# Patient Record
Sex: Male | Born: 1975 | Race: White | Hispanic: No | Marital: Married | Smoking: Former smoker
Health system: Southern US, Community
[De-identification: ages and names within clinical notes are randomized; demographics above are authoritative.]

## PROBLEM LIST (undated history)

## (undated) DIAGNOSIS — I10 Essential (primary) hypertension: Secondary | ICD-10-CM

## (undated) DIAGNOSIS — K219 Gastro-esophageal reflux disease without esophagitis: Secondary | ICD-10-CM

## (undated) HISTORY — PX: KNEE SURGERY: SHX244

## (undated) HISTORY — PX: HERNIA REPAIR: SHX51

---

## 2016-06-08 ENCOUNTER — Encounter: Payer: Self-pay | Admitting: Emergency Medicine

## 2016-06-08 ENCOUNTER — Emergency Department (INDEPENDENT_AMBULATORY_CARE_PROVIDER_SITE_OTHER): Payer: 59

## 2016-06-08 ENCOUNTER — Emergency Department
Admission: EM | Admit: 2016-06-08 | Discharge: 2016-06-08 | Disposition: A | Discharge status: Home / Self Care | Payer: 59 | Source: Home / Self Care | Attending: Family Medicine | Admitting: Family Medicine

## 2016-06-08 DIAGNOSIS — S76311A Strain of muscle, fascia and tendon of the posterior muscle group at thigh level, right thigh, initial encounter: Secondary | ICD-10-CM

## 2016-06-08 DIAGNOSIS — M25531 Pain in right wrist: Secondary | ICD-10-CM | POA: Diagnosis not present

## 2016-06-08 DIAGNOSIS — S63501A Unspecified sprain of right wrist, initial encounter: Secondary | ICD-10-CM

## 2016-06-08 HISTORY — DX: Essential (primary) hypertension: I10

## 2016-06-08 MED ORDER — MELOXICAM 15 MG PO TABS: 15.0000 mg | ORAL_TABLET | Freq: Every day | ORAL | Status: DC

## 2016-06-08 NOTE — ED Notes (Signed)
Patient fell this morning while training at work, hurting Right wrist, Right hamstring, abrasions on both elbows. He does not want to claim Worker's Comp.

## 2016-06-08 NOTE — Discharge Instructions (Signed)
Apply ice pack for 20 to 30 minutes, every 3 to 4 hours.  Continue until pain and swelling decrease.  Wear wrist splint.  Begin hamstring exercises as tolerated.  Begin wrist exercises when sharp pain subsides.   Hamstring Strain A hamstring strain is an injury that occurs when the hamstring muscles are overstretched or overloaded. The hamstring muscles are a group of muscles at the back of the thighs. These muscles are used in straightening the hips, bending the knees, and pulling back the legs. This type of injury is often called a pulled hamstring muscle. The severity of a muscle strain is rated in degrees. First-degree strains have the least amount of muscle fiber tearing and pain. Second-degree and third-degree strains have increasingly more tearing and pain. CAUSES Hamstring strains occur when a sudden, violent force is placed on these muscles and stretches them too far. This often occurs during activities that involve running, jumping, kicking, or weight lifting. RISK FACTORS Hamstring strains are especially common in athletes. Other things that can increase your risk for this injury include:  Having low strength, endurance, or flexibility of the hamstring muscles.  Performing high-impact physical activity.  Having poor physical fitness.  Having a previous leg injury.  Having fatigued muscles.  Older age. SIGNS AND SYMPTOMS  Pain in the back of the thigh.  Bruising.  Swelling.  Muscle spasm.  Difficulty using the muscle because of pain or lack of normal function. For severe strains, you may have a popping or snapping feeling when the injury occurs. DIAGNOSIS Your health care provider will perform a physical exam and ask about your medical history.  TREATMENT Often, the best treatment for a hamstring strain is protecting, resting, icing, applying compression, and elevating the injured area. This is referred to as the PRICE method of treatment. Your health care provider may  also recommend medicines to help reduce pain or inflammation. HOME CARE INSTRUCTIONS  Use the PRICE method of treatment to promote muscle healing during the first 2-3 days after your injury. The PRICE method involves:  P--Protecting the muscle from being injured again.  R--Restricting your activity and resting the injured body part.  I--Icing your injury. To do this, put ice in a plastic bag. Place a towel between your skin and the bag. Then, apply the ice and leave it on for 20 minutes, 2-3 times per day. After the third day, switch to moist heat packs.  C--Applying compression to the injured area with an elastic bandage. Be careful not to wrap it too tightly. That may interfere with blood circulation or may increase swelling.  E--Elevating the injured body part above the level of your heart as often as you can. You can do this by putting a pillow under your thigh when you sit or lie down.  Take medicines only as directed by your health care provider.  Begin exercising or stretching as directed by your health care provider.  Do not return to full activity level until your health care provider approves.  Keep all follow-up visits as directed by your health care provider. This is important. SEEK MEDICAL CARE IF:  You have increasing pain or swelling in the injured area.  You have numbness, tingling, or a significant loss of strength in the injured area.  Your foot or your toes become cold or turn blue.   This information is not intended to replace advice given to you by your health care provider. Make sure you discuss any questions you have with your health  care provider.   Document Released: 09/05/2001 Document Revised: 01/01/2015 Document Reviewed: 07/27/2014 Elsevier Interactive Patient Education 2016 [employer]   Wrist Sprain With Rehab A sprain is an injury in which a ligament that maintains the proper alignment of a joint is partially or completely torn. The ligaments of  the wrist are susceptible to sprains. Sprains are classified into three categories. Grade 1 sprains cause pain, but the tendon is not lengthened. Grade 2 sprains include a lengthened ligament because the ligament is stretched or partially ruptured. With grade 2 sprains there is still function, although the function may be diminished. Grade 3 sprains are characterized by a complete tear of the tendon or muscle, and function is usually impaired. SYMPTOMS   Pain tenderness, inflammation, and/or bruising (contusion) of the injury.  A "pop" or tear felt and/or heard at the time of injury.  Decreased wrist function. CAUSES  A wrist sprain occurs when a force is placed on one or more ligaments that is greater than it/they can withstand. Common mechanisms of injury include:  Catching a ball with your hands.  Repetitive and/ or strenuous extension or flexion of the wrist. RISK INCREASES WITH:  Previous wrist injury.  Contact sports (boxing or wrestling).  Activities in which falling is common.  Poor strength and flexibility.  Improperly fitted or padded protective equipment. PREVENTION  Warm up and stretch properly before activity.  Allow for adequate recovery between workouts.  Maintain physical fitness:  Strength, flexibility, and endurance.  Cardiovascular fitness.  Protect the wrist joint by limiting its motion with the use of taping, braces, or splints.  Protect the wrist after injury for 6 to 12 months. PROGNOSIS  The prognosis for wrist sprains depends on the degree of injury. Grade 1 sprains require 2 to 6 weeks of treatment. Grade 2 sprains require 6 to 8 weeks of treatment, and grade 3 sprains require up to 12 weeks.  RELATED COMPLICATIONS   Prolonged healing time, if improperly treated or re-injured.  Recurrent symptoms that result in a chronic problem.  Injury to nearby structures (bone, cartilage, nerves, or tendons).  Arthritis of the wrist.  Inability to  compete in athletics at a high level.  Wrist stiffness or weakness.  Progression to a complete rupture of the ligament. TREATMENT  Treatment initially involves resting from any activities that aggravate the symptoms, and the use of ice and medications to help reduce pain and inflammation. Your caregiver may recommend immobilizing the wrist for a period of time in order to reduce stress on the ligament and allow for healing. After immobilization it is important to perform strengthening and stretching exercises to help regain strength and a full range of motion. These exercises may be completed at home or with a therapist. Surgery is not usually required for wrist sprains, unless the ligament has been ruptured (grade 3 sprain). MEDICATION   If pain medication is necessary, then nonsteroidal anti-inflammatory medications, such as aspirin and ibuprofen, or other Auth pain relievers, such as acetaminophen, are often recommended.  Do not take pain medication for 7 days before surgery.  Prescription pain relievers may be given if deemed necessary by your caregiver. Use only as directed and only as much as you need. HEAT AND COLD  Cold treatment (icing) relieves pain and reduces inflammation. Cold treatment should be applied for 10 to 15 minutes every 2 to 3 hours for inflammation and pain and immediately after any activity that aggravates your symptoms. Use ice packs or massage the area with  a piece of ice (ice massage).  Heat treatment may be used prior to performing the stretching and strengthening activities prescribed by your caregiver, physical therapist, or athletic trainer. Use a heat pack or soak your injury in warm water. SEEK MEDICAL CARE IF:  Treatment seems to offer no benefit, or the condition worsens.  Any medications produce adverse side effects.

## 2016-06-08 NOTE — ED Provider Notes (Signed)
CSN: [id number]     Arrival date & time 06/08/16  1151 History   First MD Initiated Contact with Patient 06/08/16 1238     Chief Complaint  Patient presents with  . Fall     HPI Comments: Patient was sprinting during a training session at work approximately 3 hours ago.  He felt a pulling sensation in his right posterior thigh and fell, bracing himself with his right wrist.  He has had persistent soreness in his right posterior thigh, and pain in his right wrist.  Patient is a 40 y.o. male presenting with wrist pain and leg pain. The history is provided by the patient.  Wrist Pain This is a new problem. Episode onset: 3 hours ago. The problem occurs constantly. The problem has not changed since onset.Associated symptoms comments: Brief tingling in right 3rd, 4th, and 5th fingers.. Exacerbated by: flexing and extending right wrist. Nothing relieves the symptoms. He has tried nothing for the symptoms.  Leg Pain Location:  Leg Time since incident:  3 hours Injury: yes   Mechanism of injury: fall   Fall:    Fall occurred:  Running   Impact surface:  Grass Leg location:  R upper leg Pain details:    Quality:  Aching   Radiates to:  Does not radiate   Severity:  Moderate   Duration:  3 hours   Timing:  Constant   Progression:  Unchanged Chronicity:  New Prior injury to area:  No Relieved by:  None tried Worsened by:  Bearing weight, flexion and activity Ineffective treatments:  Acetaminophen Associated symptoms: stiffness   Associated symptoms: no back pain, no decreased ROM, no muscle weakness, no numbness, no swelling and no tingling     Past Medical History  Diagnosis Date  . Hypertension    Past Surgical History  Procedure Laterality Date  . Hernia repair    . Knee surgery     No family history on file. Social History  Substance Use Topics  . Smoking status: Never Smoker   . Smokeless tobacco: None  . Alcohol Use: No    Review of Systems  Musculoskeletal: Positive  for stiffness. Negative for back pain.  All other systems reviewed and are negative.   Allergies  Review of patient's allergies indicates no known allergies.  Home Medications   Prior to Admission medications   Medication Sig Start Date End Date Taking? Authorizing Provider  lisinopril (PRINIVIL,ZESTRIL) 10 MG tablet Take 10 mg by mouth daily.   Yes Historical Provider, MD  omeprazole (PRILOSEC) 10 MG capsule Take 10 mg by mouth daily.   Yes Historical Provider, MD  meloxicam (MOBIC) 15 MG tablet Take 1 tablet (15 mg total) by mouth daily. Take with food each morning 06/08/16   Lattie Haw, MD   Meds Ordered and Administered this Visit  Medications - No data to display  BP 108/73 mmHg  Pulse 81  Temp(Src) 98 F (36.7 C) (Oral)  Ht  (1.676 m)  Wt 173 lb (78.472 kg)  BMI 27.94 kg/m2  SpO2 97% No data found.   Physical Exam  Constitutional: He is oriented to person, place, and time. He appears well-developed and well-nourished. No distress.  HENT:  Head: Atraumatic.  Right Ear: External ear normal.  Left Ear: External ear normal.  Nose: Nose normal.  Eyes: Conjunctivae are normal. Pupils are equal, round, and reactive to light.  Pulmonary/Chest: No respiratory distress.  Musculoskeletal:       Left wrist: He  exhibits decreased range of motion, tenderness and bony tenderness. He exhibits no swelling, no effusion, no crepitus, no deformity and no laceration.       Arms:      Right upper leg: He exhibits tenderness. He exhibits no bony tenderness, no swelling, no edema, no deformity and no laceration.       Legs: Right wrist has tenderness to palpation over ulnar aspect.  Right posterior thigh has tenderness to palpation as noted on diagram.  No swelling or ecchymosis.  Right hip has full range of motion.  Distal neurovascular function is intact.   Neurological: He is alert and oriented to person, place, and time.  Skin: Skin is warm and dry.  Nursing note and vitals  reviewed.   ED Course  Procedures none    Imaging Review Dg Wrist Complete Right  06/08/2016  CLINICAL DATA:  Pain following fall EXAM: RIGHT WRIST - COMPLETE 3+ VIEW COMPARISON:  None. FINDINGS: Frontal, oblique, lateral, and ulnar deviation scaphoid images were obtained. There is no demonstrable fracture or dislocation. The joint spaces appear normal. No erosive change. IMPRESSION: No fracture or dislocation.  No appreciable arthropathy. Electronically Signed   By: Bretta BangWilliam  Woodruff III M.D.   On: 06/08/2016 13:23       MDM   1. Right hamstring muscle strain, initial encounter   2. Sprain of right wrist, initial encounter (concern for possible TFCC injury)   Wrist splint applied.  Begin Mobic 15mg  daily. Apply ice pack for 20 to 30 minutes, every 3 to 4 hours.  Continue until pain and swelling decrease.  Wear wrist splint.  Begin hamstring exercises as tolerated.  Begin wrist exercises when sharp pain subsides. Followup with Dr. Rodney Langtonhomas Thekkekandam or Dr. Clementeen GrahamEvan Corey ([facility]) if not improving about two weeks.     Lattie HawStephen A Atthew Coutant, MD 06/08/16 [id number]

## 2017-05-30 IMAGING — DX DG WRIST COMPLETE 3+V*R*
4 series · 4 of 4 positions shown · non-contrast
Comparison: None.

CLINICAL DATA: Pain following fall

EXAM:
RIGHT WRIST - COMPLETE 3+ VIEW

[wrist pa]
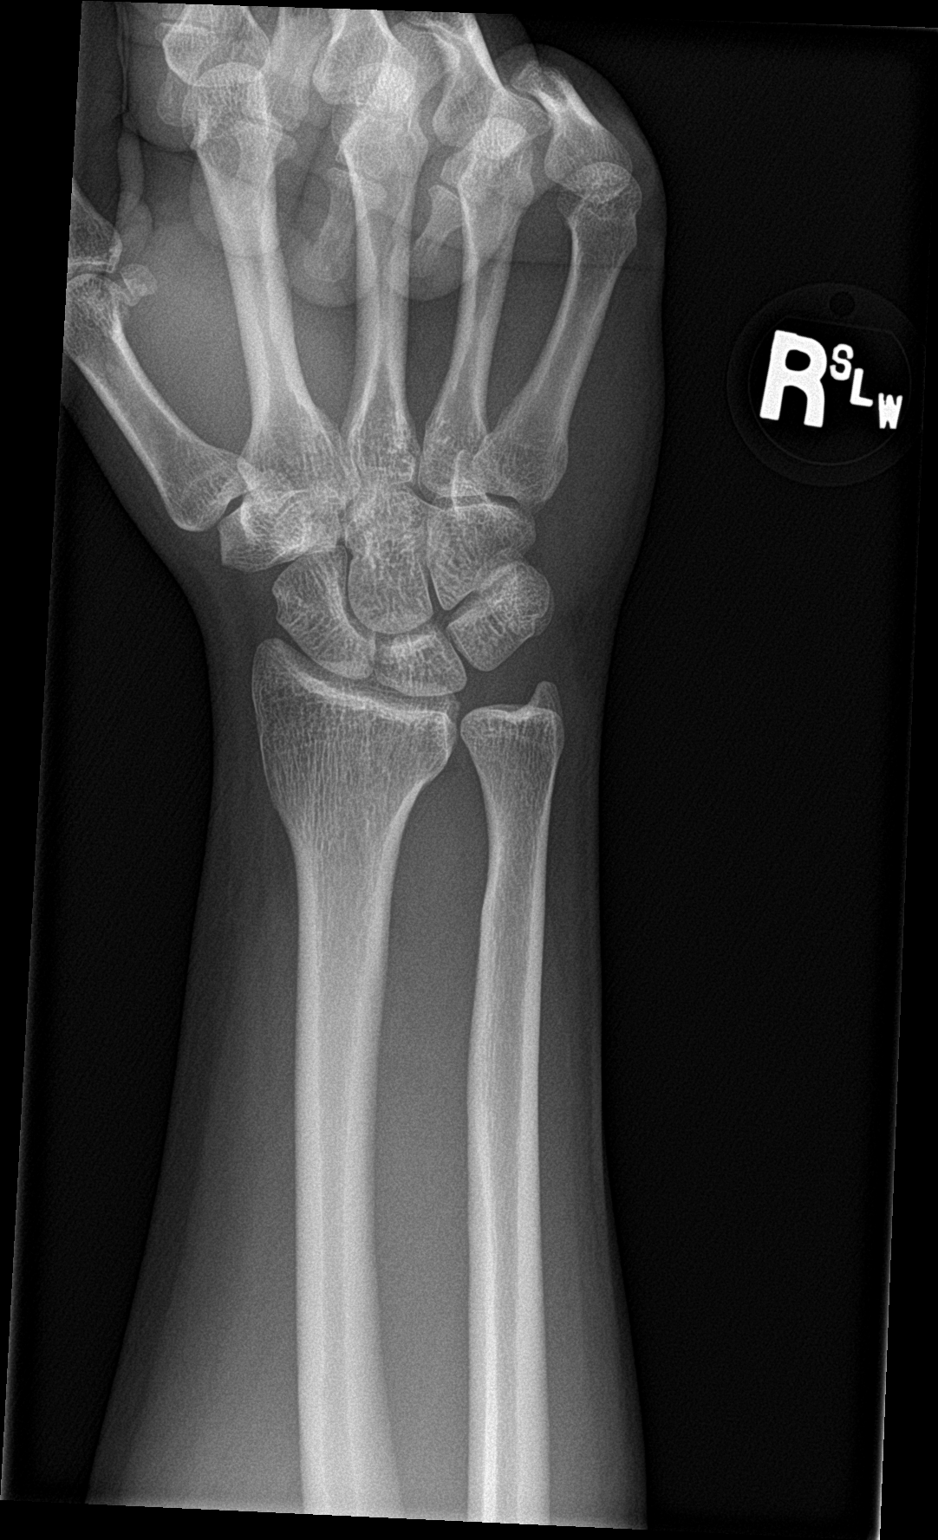

[wrist obl]
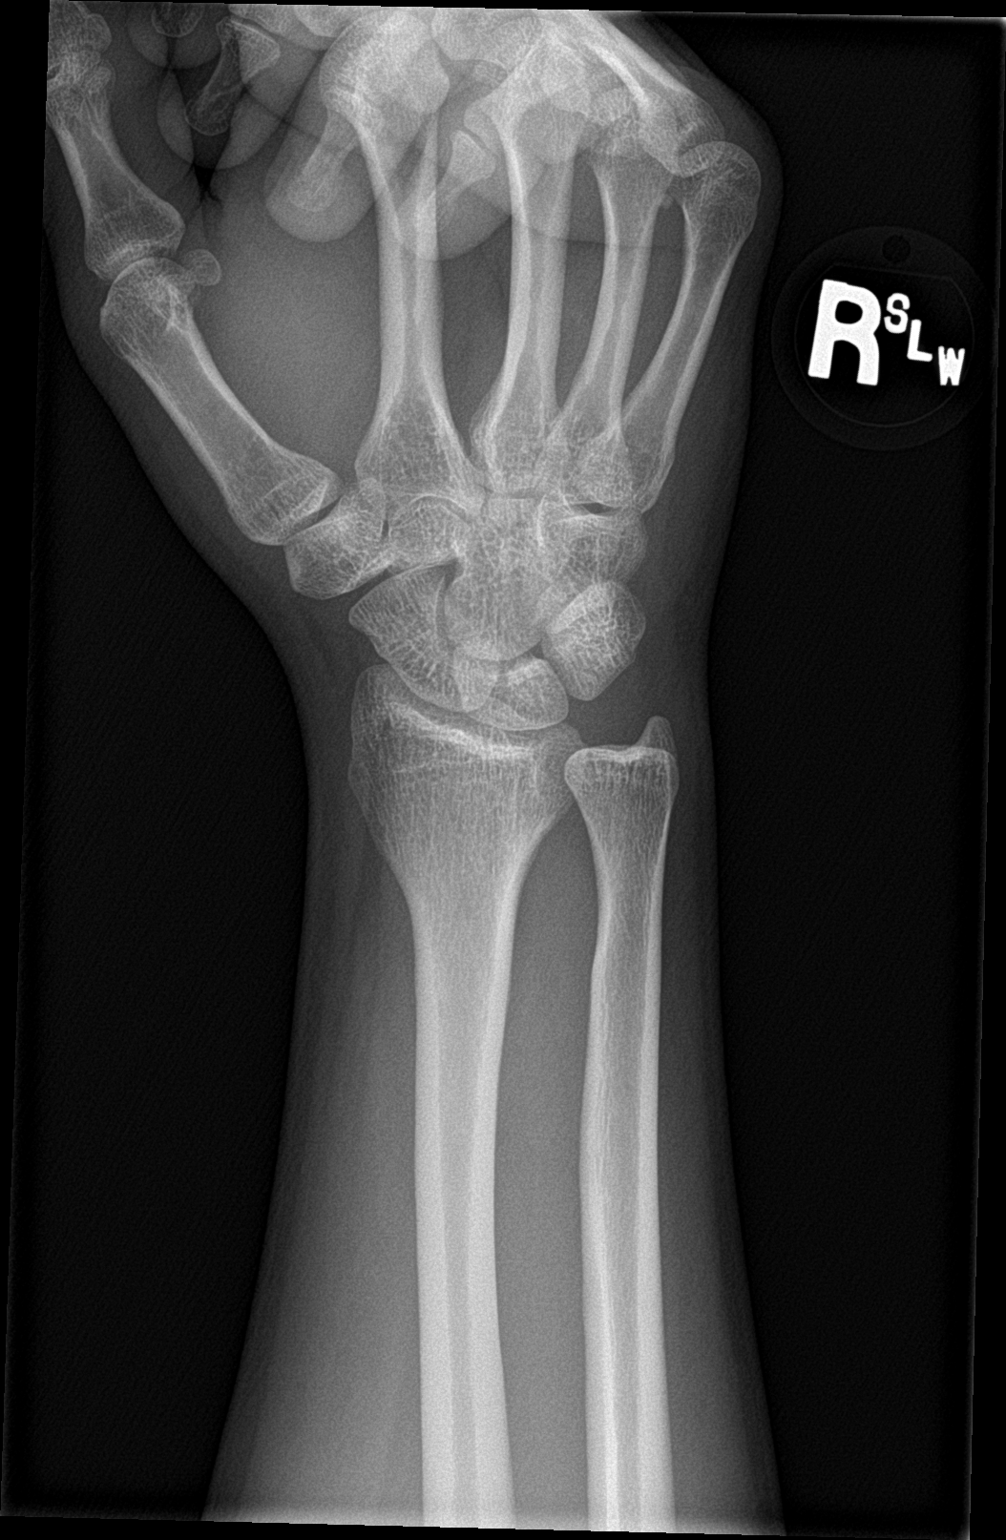

[wrist lat]
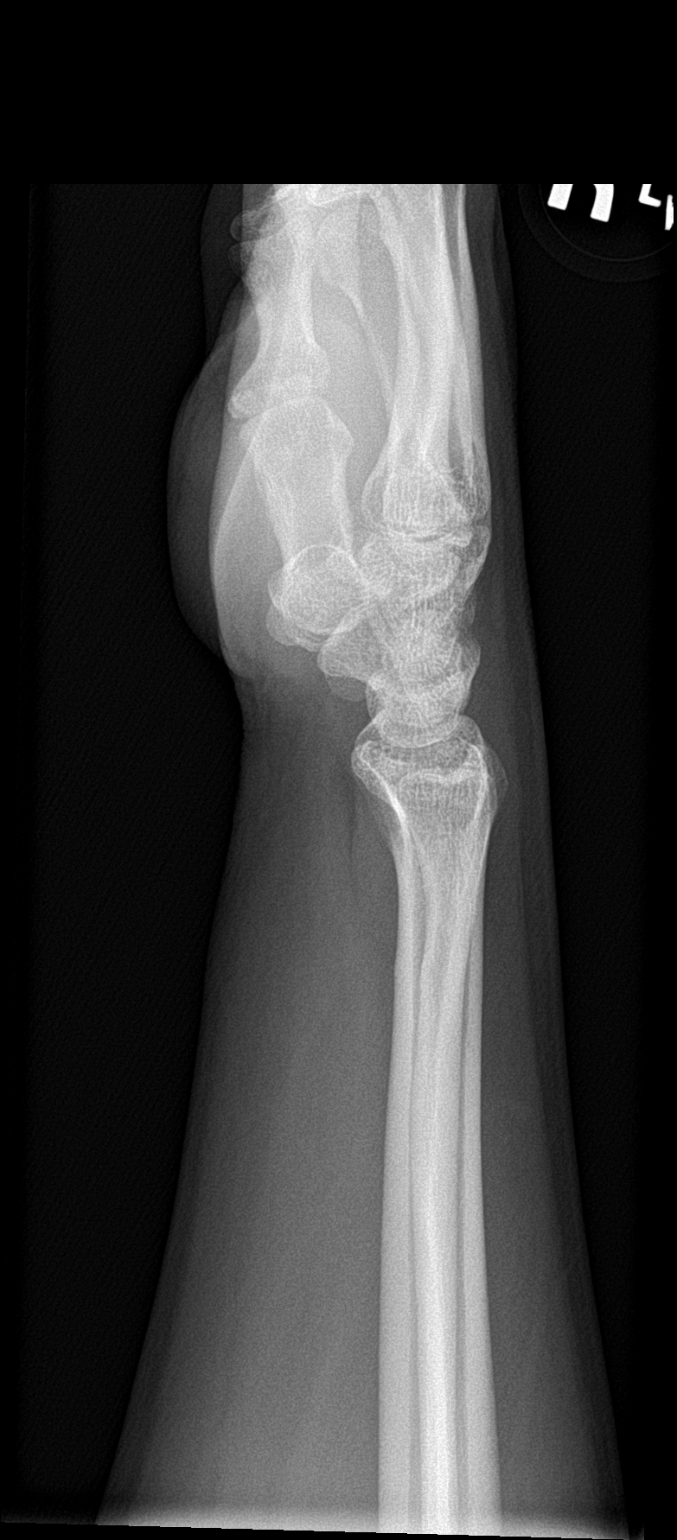

[wrist navicular]
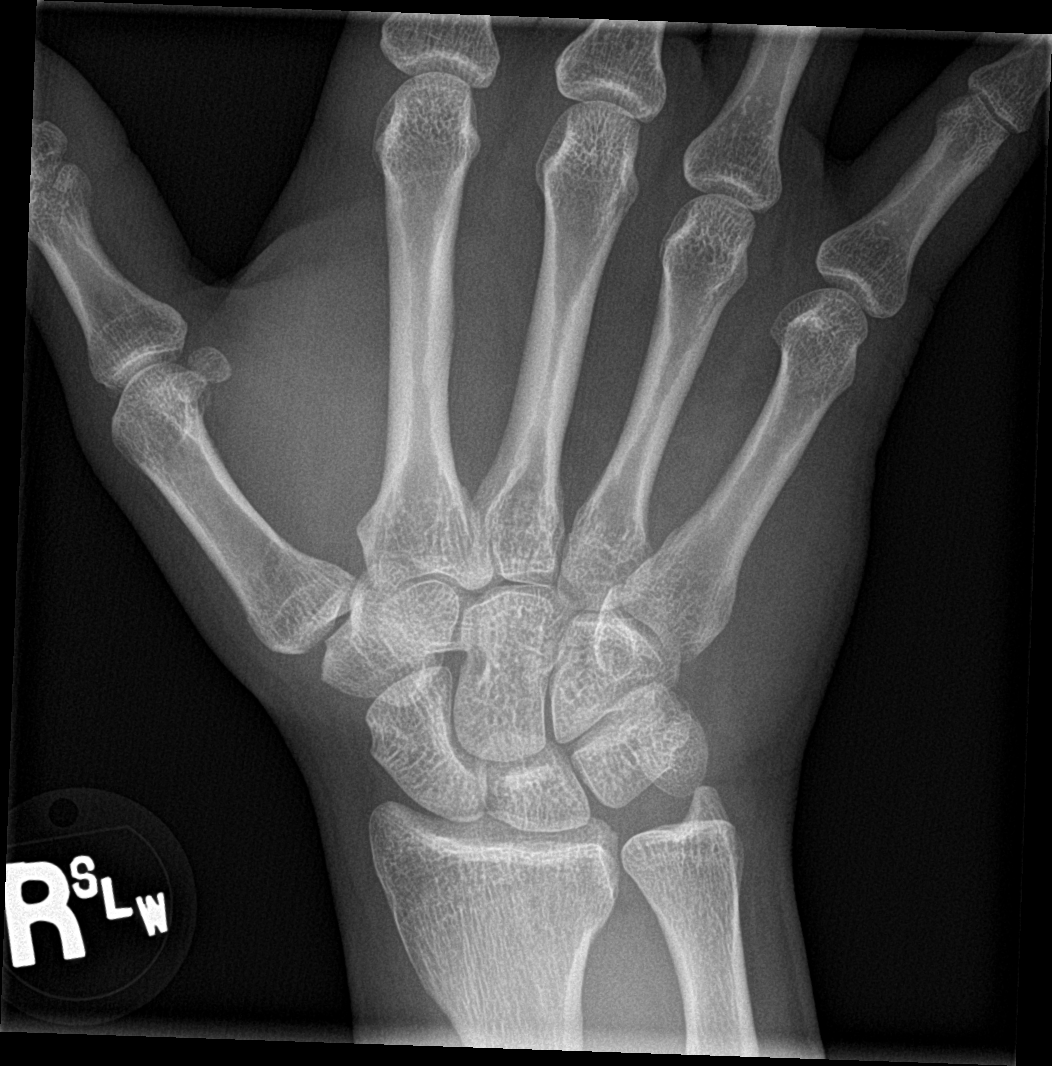

[4 of 4 positions shown; findings below may reference images not displayed]

FINDINGS: Frontal, oblique, lateral, and ulnar deviation scaphoid images were
obtained. There is no demonstrable fracture or dislocation. The
joint spaces appear normal. No erosive change.
IMPRESSION: No fracture or dislocation.  No appreciable arthropathy.

## 2018-02-03 ENCOUNTER — Encounter: Payer: Self-pay | Admitting: Emergency Medicine

## 2018-02-03 ENCOUNTER — Emergency Department (INDEPENDENT_AMBULATORY_CARE_PROVIDER_SITE_OTHER): Payer: 59

## 2018-02-03 ENCOUNTER — Emergency Department
Admission: EM | Admit: 2018-02-03 | Discharge: 2018-02-03 | Disposition: A | Discharge status: Home / Self Care | Payer: 59 | Source: Home / Self Care | Attending: Family Medicine | Admitting: Family Medicine

## 2018-02-03 DIAGNOSIS — S60312A Abrasion of left thumb, initial encounter: Secondary | ICD-10-CM | POA: Diagnosis not present

## 2018-02-03 DIAGNOSIS — M79645 Pain in left finger(s): Secondary | ICD-10-CM

## 2018-02-03 DIAGNOSIS — W010XXA Fall on same level from slipping, tripping and stumbling without subsequent striking against object, initial encounter: Secondary | ICD-10-CM | POA: Diagnosis not present

## 2018-02-03 MED ORDER — IBUPROFEN 600 MG PO TABS
600.0000 mg | ORAL_TABLET | Freq: Once | ORAL | Status: AC
  Administered 2018-02-03: 600 mg via ORAL

## 2018-02-03 NOTE — ED Triage Notes (Signed)
Patient complaining, he fell today and is now complaining of left hand/thumb pain.  No loss of consc.

## 2018-02-03 NOTE — Discharge Instructions (Signed)
°  You may take acetaminophen and ibuprofen as needed for pain. Wear the splint as needed for pain and extra support.  If pain still present after 1-2 weeks, it is recommended you be reevaluated to make sure there is no occult (hidden) fracture.

## 2018-02-03 NOTE — ED Provider Notes (Signed)
[facility]    CSN: [id number] Arrival date & time: 02/03/18  1620     History   Chief Complaint Chief Complaint  Patient presents with  . Hand Injury    HPI Martin Barnes is a 42 y.o. male.   HPI Martin Barnes is a 42 y.o. male presenting to UC with c/o Left thumb pain that started earlier today around 1PM after he tripped and fell on cement with his hand out stretched. He has a small abrasion on his Left thumb but states his thumb hurts with movement and wanted to be evaluated.  Pain is aching and sore, 7/10 at worst.  No pain medication taken PTA.  He is Right hand dominant.  No other injuries.    Past Medical History:  Diagnosis Date  . Hypertension     There are no active problems to display for this patient.   Past Surgical History:  Procedure Laterality Date  . HERNIA REPAIR    . KNEE SURGERY         Home Medications    Prior to Admission medications   Medication Sig Start Date End Date Taking? Authorizing Provider  lisinopril (PRINIVIL,ZESTRIL) 10 MG tablet Take 10 mg by mouth daily.    [provider]  meloxicam (MOBIC) 15 MG tablet Take 1 tablet (15 mg total) by mouth daily. Take with food each morning 06/08/16   Lattie HawBeese, Stephen A, MD  omeprazole (PRILOSEC) 10 MG capsule Take 10 mg by mouth daily.    [provider]    Family History No family history on file.  Social History Social History   Tobacco Use  . Smoking status: Never Smoker  . Smokeless tobacco: Current User  Substance Use Topics  . Alcohol use: No  . Drug use: Not on file     Allergies   Patient has no known allergies.   Review of Systems Review of Systems  Musculoskeletal: Positive for arthralgias. Negative for joint swelling and myalgias.  Skin: Positive for wound. Negative for color change.  Neurological: Negative for weakness and numbness.     Physical Exam Triage Vital Signs ED Triage Vitals  Enc Vitals Group     BP      Pulse     Resp      Temp      Temp src      SpO2      Weight      Height      Head Circumference      Peak Flow      Pain Score      Pain Loc      Pain Edu?      Excl. in GC?    No data found.  Updated Vital Signs BP (!) 141/96 (BP Location: Right Arm)   Pulse 89   Temp 99 F (37.2 C) (Oral)   Ht 5\' 6"  (1.676 m)   Wt 182 lb 4 oz (82.7 kg)   SpO2 97%   BMI 29.42 kg/m   Visual Acuity Right Eye Distance:   Left Eye Distance:   Bilateral Distance:    Right Eye Near:   Left Eye Near:    Bilateral Near:     Physical Exam  Constitutional: He is oriented to person, place, and time. He appears well-developed and well-nourished. No distress.  HENT:  Head: Normocephalic and atraumatic.  Eyes: EOM are normal.  Neck: Normal range of motion.  Cardiovascular: Normal rate.  Pulmonary/Chest: Effort normal.  Musculoskeletal:  Normal range of motion. He exhibits tenderness. He exhibits no edema.  Left hand: no edema or deformity. Mild tenderness in snuff box and to proximal phalanx of thumb. Full ROM No tenderness to the rest of hand, fingers or wrist.   Neurological: He is alert and oriented to person, place, and time.  Skin: Skin is warm and dry. Capillary refill takes less than 2 seconds. He is not diaphoretic.  Left thumb, dorsal aspect: superficial abrasion to distal phalanx.   Psychiatric: He has a normal mood and affect. His behavior is normal.  Nursing note and vitals reviewed.    UC Treatments / Results  Labs (all labs ordered are listed, but only abnormal results are displayed) Labs Reviewed - No data to display  EKG  EKG Interpretation None       Radiology Dg Hand Complete Left  Result Date: 02/03/2018 CLINICAL DATA:  Pt states he fell today and injured his left hand. States entire left thumb is in pain. EXAM: LEFT HAND - COMPLETE 3+ VIEW COMPARISON:  None. FINDINGS: There is no evidence of fracture or dislocation. There is no evidence of arthropathy or other focal  bone abnormality. Soft tissues are unremarkable. IMPRESSION: Negative. Electronically Signed   By: Amie Portland M.D.   On: 02/03/2018 16:58    Procedures Procedures (including critical care time)  Medications Ordered in UC Medications  ibuprofen (ADVIL,MOTRIN) tablet 600 mg (600 mg Oral Given 02/03/18 1648)     Initial Impression / Assessment and Plan / UC Course  I have reviewed the triage vital signs and the nursing notes.  Pertinent labs & imaging results that were available during my care of the patient were reviewed by me and considered in my medical decision making (see chart for details).     Plain film: negative for fracture or dislocation Pt placed in thumb spica splint for comfort Encouraged f/u with PCP or Sports Medicine in 1-2 weks if not improving. Discussed possibility of occult scaphoid fracture given MOI Home care instructions provided    Final Clinical Impressions(s) / UC Diagnoses   Final diagnoses:  Fall from slip, trip, or stumble, initial encounter  Pain of left thumb  Abrasion of left thumb, initial encounter    ED Discharge Orders    None       Controlled Substance Prescriptions [place] Controlled Substance Registry consulted? Not Applicable   Rolla Plate 02/03/18 1745

## 2019-11-03 ENCOUNTER — Emergency Department (INDEPENDENT_AMBULATORY_CARE_PROVIDER_SITE_OTHER)
Admission: EM | Admit: 2019-11-03 | Discharge: 2019-11-03 | Disposition: A | Discharge status: Home / Self Care | Payer: BC Managed Care – PPO | Source: Home / Self Care

## 2019-11-03 ENCOUNTER — Encounter: Payer: Self-pay | Admitting: *Deleted

## 2019-11-03 ENCOUNTER — Emergency Department (INDEPENDENT_AMBULATORY_CARE_PROVIDER_SITE_OTHER): Payer: BC Managed Care – PPO

## 2019-11-03 ENCOUNTER — Other Ambulatory Visit: Payer: Self-pay

## 2019-11-03 DIAGNOSIS — R52 Pain, unspecified: Secondary | ICD-10-CM | POA: Diagnosis not present

## 2019-11-03 DIAGNOSIS — R509 Fever, unspecified: Secondary | ICD-10-CM

## 2019-11-03 DIAGNOSIS — R5383 Other fatigue: Secondary | ICD-10-CM

## 2019-11-03 DIAGNOSIS — R05 Cough: Secondary | ICD-10-CM

## 2019-11-03 DIAGNOSIS — R059 Cough, unspecified: Secondary | ICD-10-CM

## 2019-11-03 HISTORY — DX: Gastro-esophageal reflux disease without esophagitis: K21.9

## 2019-11-03 LAB — POCT CBC W AUTO DIFF (K'VILLE URGENT CARE)

## 2019-11-03 NOTE — ED Triage Notes (Signed)
Pt c/ cough, fever, chills, and body aches x 50mth. 99.4-101. Pt reports [place] PCP treating him for Pneumonia with Doxycycline.

## 2019-11-03 NOTE — ED Provider Notes (Signed)
[facility]    CSN: [id number] Arrival date & time: 11/03/19  0858      History   Chief Complaint Chief Complaint  Patient presents with  . Cough    HPI Martin Barnes is a 43 y.o. male.   HPI  Martin Barnes is a 43 y.o. male presenting to UC with c/o 3-4 weeks of fever 99.4*F-101*F, body aches, chills, fatigue and cough.  He has had 2 negative Covid tests since onset of symptoms. Last negative test was 10/27/2019 after having a virtual visit with his PCP.  Pt was started on Doxycycline at that time for possible pneumonia.  Pt denies chest pain or SOB but states after 2.5 days on his doxycycline he is not feeling any better.  His PCP recommended he get a CXR.  Denies hx of mono or known tick bites. Denies recent travel.    Past Medical History:  Diagnosis Date  . GERD (gastroesophageal reflux disease)   . Hypertension     There are no active problems to display for this patient.   Past Surgical History:  Procedure Laterality Date  . HERNIA REPAIR    . KNEE SURGERY         Home Medications    Prior to Admission medications   Medication Sig Start Date End Date Taking? Authorizing Provider  pantoprazole (PROTONIX) 40 MG tablet Take by mouth. 08/15/16 01/09/20 Yes [provider]  lisinopril (PRINIVIL,ZESTRIL) 10 MG tablet Take 10 mg by mouth daily.    [provider]    Family History Family History  Problem Relation Age of Onset  . Heart disease Mother   . Diabetes Mother   . Heart disease Father   . Diabetes Father     Social History Social History   Tobacco Use  . Smoking status: Former Smoker    Quit date: 10/03/2019    Years since quitting: 0.0  . Smokeless tobacco: Current User  Substance Use Topics  . Alcohol use: No  . Drug use: Not on file     Allergies   Patient has no known allergies.   Review of Systems Review of Systems  Constitutional: Positive for appetite change, chills, diaphoresis, fatigue and fever.   HENT: Positive for congestion. Negative for ear pain, sore throat, trouble swallowing and voice change.   Respiratory: Positive for cough. Negative for shortness of breath.   Cardiovascular: Negative for chest pain and palpitations.  Gastrointestinal: Negative for abdominal pain, diarrhea, nausea and vomiting.  Musculoskeletal: Positive for arthralgias, back pain and myalgias.  Skin: Negative for rash.  Neurological: Positive for weakness (generalized). Negative for dizziness, light-headedness and headaches.     Physical Exam Triage Vital Signs ED Triage Vitals  Enc Vitals Group     BP 11/03/19 0918 115/79     Pulse Rate 11/03/19 0918 99     Resp 11/03/19 0918 18     Temp 11/03/19 0918 99.2 F (37.3 C)     Temp Source 11/03/19 0918 Oral     SpO2 11/03/19 0918 98 %     Weight 11/03/19 0919 175 lb (79.4 kg)     Height 11/03/19 0919 5\' 6"  (1.676 m)     Head Circumference --      Peak Flow --      Pain Score 11/03/19 0919 0     Pain Loc --      Pain Edu? --      Excl. in GC? --    No data  found.  Updated Vital Signs BP 115/79 (BP Location: Left Arm)   Pulse 99   Temp 99.2 F (37.3 C) (Oral)   Resp 18   Ht 5\' 6"  (1.676 m)   Wt 175 lb (79.4 kg)   SpO2 98%   BMI 28.25 kg/m   Visual Acuity Right Eye Distance:   Left Eye Distance:   Bilateral Distance:    Right Eye Near:   Left Eye Near:    Bilateral Near:     Physical Exam Vitals signs and nursing note reviewed.  Constitutional:      General: He is not in acute distress.    Appearance: Normal appearance. He is well-developed. He is not ill-appearing, toxic-appearing or diaphoretic.  HENT:     Head: Normocephalic and atraumatic.     Right Ear: Tympanic membrane and ear canal normal.     Left Ear: Tympanic membrane and ear canal normal.     Nose: Nose normal.     Right Sinus: No maxillary sinus tenderness or frontal sinus tenderness.     Left Sinus: No maxillary sinus tenderness or frontal sinus tenderness.      Mouth/Throat:     Lips: Pink.     Mouth: Mucous membranes are moist.     Pharynx: Oropharynx is clear. Uvula midline. No pharyngeal swelling, oropharyngeal exudate, posterior oropharyngeal erythema or uvula swelling.     Tonsils: No tonsillar exudate or tonsillar abscesses.  Neck:     Musculoskeletal: Normal range of motion.  Cardiovascular:     Rate and Rhythm: Normal rate and regular rhythm.  Pulmonary:     Effort: Pulmonary effort is normal. No respiratory distress.     Breath sounds: Normal breath sounds. No stridor. No wheezing, rhonchi or rales.  Musculoskeletal: Normal range of motion.  Skin:    General: Skin is warm and dry.  Neurological:     Mental Status: He is alert and oriented to person, place, and time.  Psychiatric:        Behavior: Behavior normal.      UC Treatments / Results  Labs (all labs ordered are listed, but only abnormal results are displayed) Labs Reviewed  COMPLETE METABOLIC PANEL WITH GFR  B. BURGDORFI ANTIBODIES  ROCKY MTN SPOTTED FVR ABS PNL(IGG+IGM)  EPSTEIN-BARR VIRUS EARLY D ANTIGEN ANTIBODY, IGG  EPSTEIN-BARR VIRUS NUCLEAR ANTIGEN ANTIBODY, IGG  EPSTEIN-BARR VIRUS VCA, IGG  EPSTEIN-BARR VIRUS VCA, IGM  POCT CBC W AUTO DIFF ([facility])    EKG   Radiology Dg Chest 2 View  Result Date: 11/03/2019 CLINICAL DATA:  Cough and fever EXAM: CHEST - 2 VIEW COMPARISON:  None. FINDINGS: The heart size and mediastinal contours are within normal limits. Both lungs are clear. The visualized skeletal structures are unremarkable. IMPRESSION: No active cardiopulmonary disease. Electronically Signed   By: Judie PetitM.  Shick M.D.   On: 11/03/2019 09:46    Procedures Procedures (including critical care time)  Medications Ordered in UC Medications - No data to display  Initial Impression / Assessment and Plan / UC Course  I have reviewed the triage vital signs and the nursing notes.  Pertinent labs & imaging results that were available during my  care of the patient were reviewed by me and considered in my medical decision making (see chart for details).     Normal CXR CBC: unremarkable Due to duration of symptoms, recommend testing for mono and tick fever.  Pt understanding and agreeable with plan as he "just wants to know what's wrong."  He declined having another Covid test as he has had 2 since onset of symptoms, last test was 1 week ago. AVS provided  Final Clinical Impressions(s) / UC Diagnoses   Final diagnoses:  Fever in adult  Generalized body aches  Fatigue, unspecified type  Cough     Discharge Instructions      You may take 500mg  acetaminophen every 4-6 hours or in combination with ibuprofen 400-600mg  every 6-8 hours as needed for pain, inflammation, and fever.  Be sure to well hydrated with clear liquids and get at least 8 hours of sleep at night, preferably more while sick.   Please call to schedule a follow up visit with your family doctor to review today's labs and visit in urgent care. Continue to take your doxycycline as previously prescribed.  Call 911 or go to the hospital if symptoms significantly worsening.      ED Prescriptions    None     PDMP not reviewed this encounter.   Noe Gens, PA-C 11/03/19 1147

## 2019-11-03 NOTE — Discharge Instructions (Signed)
°  You may take 500mg  acetaminophen every 4-6 hours or in combination with ibuprofen 400-600mg  every 6-8 hours as needed for pain, inflammation, and fever.  Be sure to well hydrated with clear liquids and get at least 8 hours of sleep at night, preferably more while sick.   Please call to schedule a follow up visit with your family doctor to review today's labs and visit in urgent care. Continue to take your doxycycline as previously prescribed.  Call 911 or go to the hospital if symptoms significantly worsening.

## 2019-11-06 ENCOUNTER — Telehealth: Payer: Self-pay | Admitting: *Deleted

## 2019-11-06 LAB — COMPLETE METABOLIC PANEL WITH GFR
AG Ratio: 1.4 (calc) (ref 1.0–2.5)
ALT: 39 U/L (ref 9–46)
AST: 26 U/L (ref 10–40)
Albumin: 3.9 g/dL (ref 3.6–5.1)
Alkaline phosphatase (APISO): 51 U/L (ref 36–130)
BUN: 15 mg/dL (ref 7–25)
CO2: 25 mmol/L (ref 20–32)
Calcium: 9.4 mg/dL (ref 8.6–10.3)
Chloride: 105 mmol/L (ref 98–110)
Creat: 1.09 mg/dL (ref 0.60–1.35)
GFR, Est African American: 97 mL/min/{1.73_m2} (ref >=60)
GFR, Est Non African American: 84 mL/min/{1.73_m2} (ref >=60)
Globulin: 2.7 g/dL (calc) (ref 1.9–3.7)
Glucose, Bld: 109 mg/dL — ABNORMAL HIGH (ref 65–99)
Potassium: 4.2 mmol/L (ref 3.5–5.3)
Sodium: 139 mmol/L (ref 135–146)
Total Bilirubin: 0.7 mg/dL (ref 0.2–1.2)
Total Protein: 6.6 g/dL (ref 6.1–8.1)

## 2019-11-06 LAB — EPSTEIN-BARR VIRUS VCA, IGM: EBV VCA IgM: 153 U/mL — ABNORMAL HIGH

## 2019-11-06 LAB — B. BURGDORFI ANTIBODIES: B burgdorferi Ab IgG+IgM: <0.9 index

## 2019-11-06 LAB — ROCKY MTN SPOTTED FVR ABS PNL(IGG+IGM)
RMSF IgG: NOT DETECTED
RMSF IgM: NOT DETECTED

## 2019-11-06 LAB — EPSTEIN-BARR VIRUS EARLY D ANTIGEN ANTIBODY, IGG: EBV EA IgG: <9 U/mL

## 2019-11-06 LAB — EPSTEIN-BARR VIRUS VCA, IGG: EBV VCA IgG: >750 U/mL — ABNORMAL HIGH

## 2019-11-06 LAB — EPSTEIN-BARR VIRUS NUCLEAR ANTIGEN ANTIBODY, IGG: EBV NA IgG: 134 U/mL — ABNORMAL HIGH

## 2019-11-06 NOTE — Telephone Encounter (Signed)
Patient had not received lab results at this point. Lab results given and discussed. She reports patient is still periodically running a fever, fatigued and coughing. Per Dr. Aurelio Brash recommendation, continue the doxycycline, stay out of work until symptoms are improved. 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 Encouraged to avoid strenuous exercise.RMSF still pending.

## 2020-10-24 IMAGING — DX DG CHEST 2V
1 series · 1 of 1 positions shown · non-contrast
Comparison: None.

CLINICAL DATA: Cough and fever

EXAM:
CHEST - 2 VIEW

[chest pa]
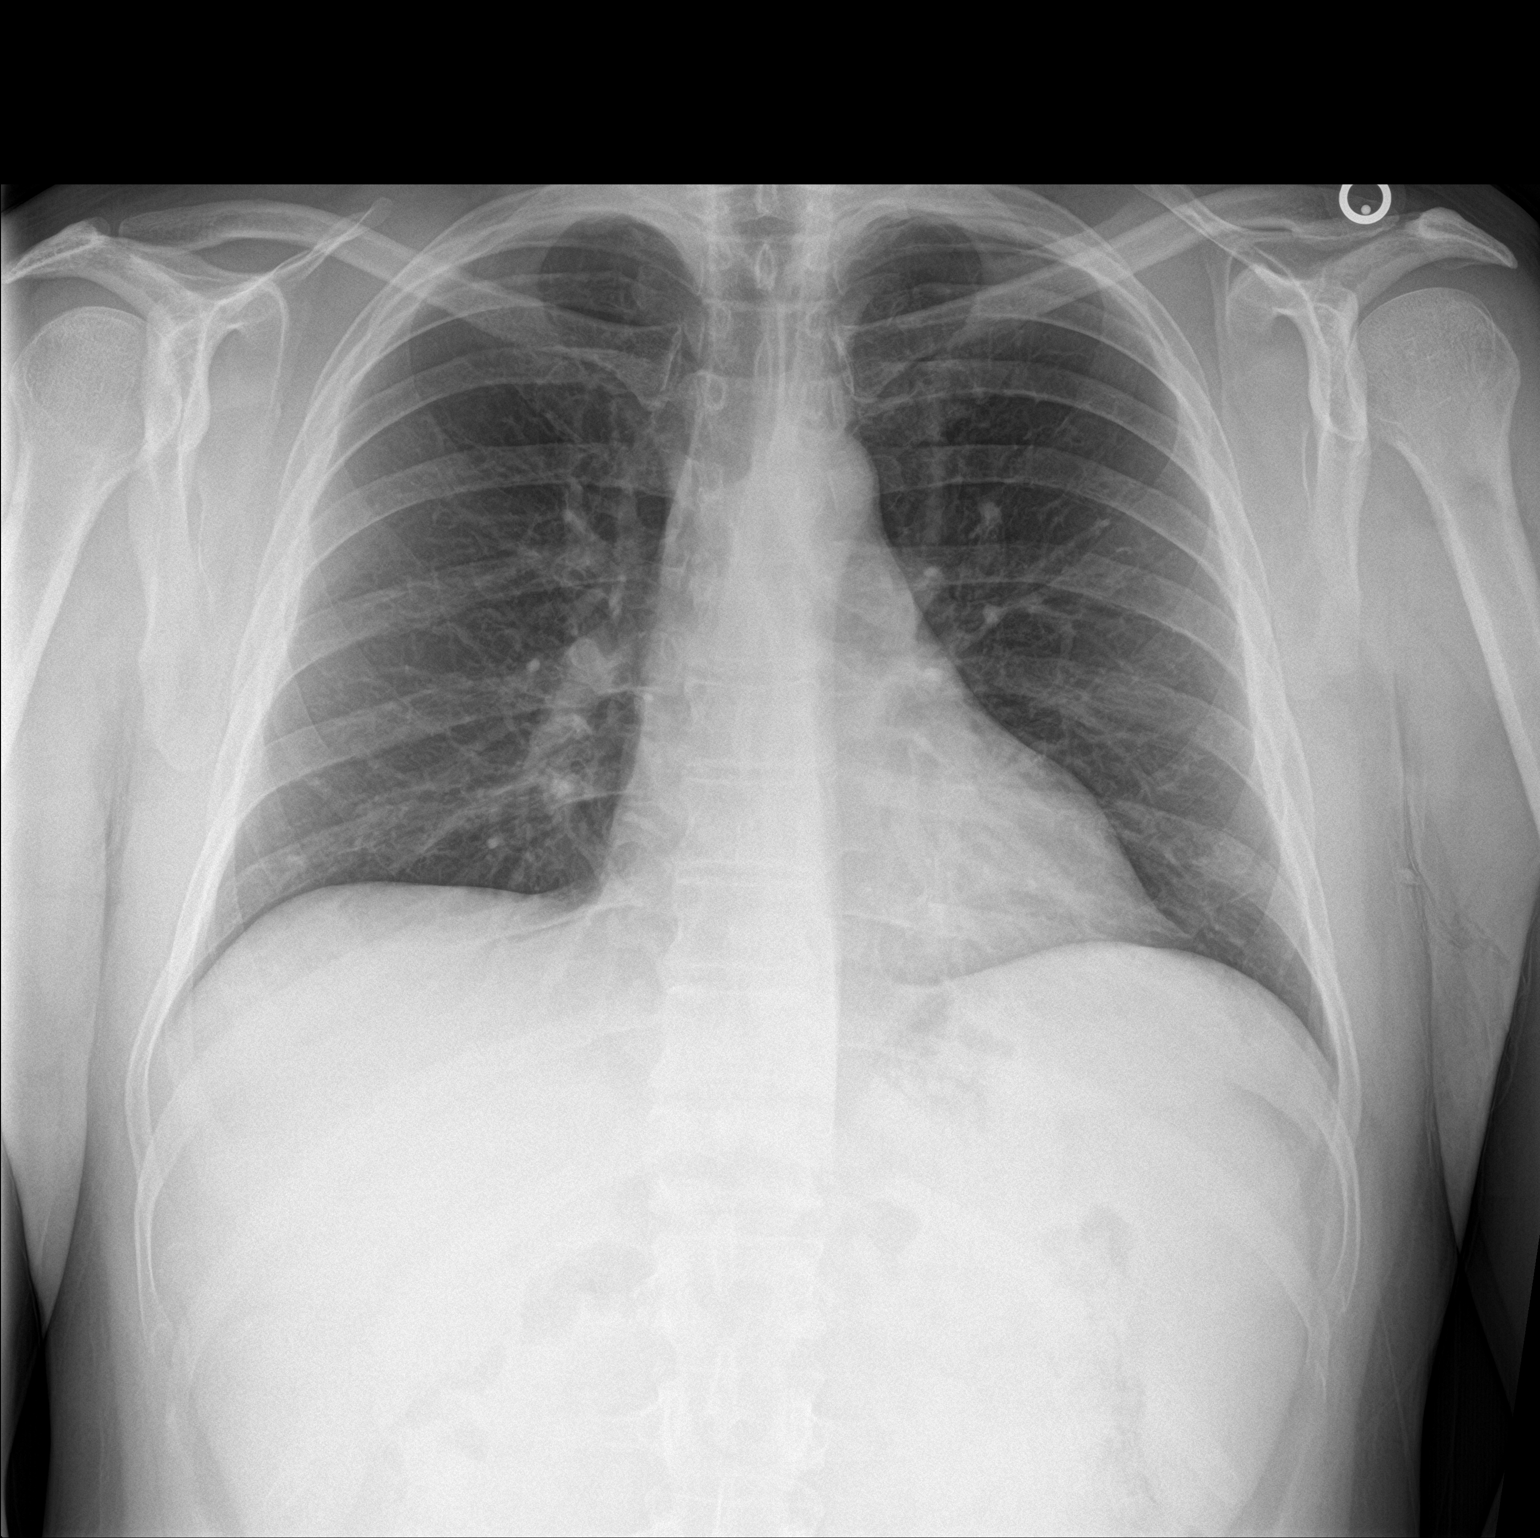

[1 of 1 positions shown; findings below may reference images not displayed]

FINDINGS: The heart size and mediastinal contours are within normal limits.
Both lungs are clear. The visualized skeletal structures are
unremarkable.
IMPRESSION: No active cardiopulmonary disease.
